# Patient Record
Sex: Male | Born: 1975 | Race: Black or African American | Hispanic: No | Marital: Single | Smoking: Current every day smoker
Health system: Southern US, Community
[De-identification: ages and names within clinical notes are randomized; demographics above are authoritative.]

---

## 2003-10-26 ENCOUNTER — Emergency Department: Payer: Self-pay | Admitting: Unknown Physician Specialty

## 2006-12-07 ENCOUNTER — Other Ambulatory Visit: Payer: Self-pay

## 2006-12-07 ENCOUNTER — Emergency Department: Payer: Self-pay | Admitting: Emergency Medicine

## 2007-09-02 ENCOUNTER — Emergency Department: Payer: Self-pay | Admitting: Emergency Medicine

## 2009-05-31 ENCOUNTER — Emergency Department: Payer: Self-pay | Admitting: Emergency Medicine

## 2014-09-05 ENCOUNTER — Emergency Department
Admission: EM | Admit: 2014-09-05 | Discharge: 2014-09-05 | Disposition: A | Discharge status: Home / Self Care | Payer: Self-pay | Attending: Emergency Medicine | Admitting: Emergency Medicine

## 2014-09-05 ENCOUNTER — Encounter: Payer: Self-pay | Admitting: Emergency Medicine

## 2014-09-05 ENCOUNTER — Emergency Department: Payer: Self-pay

## 2014-09-05 DIAGNOSIS — S8992XA Unspecified injury of left lower leg, initial encounter: Secondary | ICD-10-CM | POA: Insufficient documentation

## 2014-09-05 DIAGNOSIS — Y998 Other external cause status: Secondary | ICD-10-CM | POA: Insufficient documentation

## 2014-09-05 DIAGNOSIS — Z720 Tobacco use: Secondary | ICD-10-CM | POA: Insufficient documentation

## 2014-09-05 DIAGNOSIS — M25562 Pain in left knee: Secondary | ICD-10-CM

## 2014-09-05 DIAGNOSIS — X58XXXA Exposure to other specified factors, initial encounter: Secondary | ICD-10-CM | POA: Insufficient documentation

## 2014-09-05 DIAGNOSIS — M25462 Effusion, left knee: Secondary | ICD-10-CM | POA: Insufficient documentation

## 2014-09-05 DIAGNOSIS — Y9289 Other specified places as the place of occurrence of the external cause: Secondary | ICD-10-CM | POA: Insufficient documentation

## 2014-09-05 DIAGNOSIS — Y9389 Activity, other specified: Secondary | ICD-10-CM | POA: Insufficient documentation

## 2014-09-05 MED ORDER — OXYCODONE-ACETAMINOPHEN 5-325 MG PO TABS: 1.0000 | ORAL_TABLET | Freq: Three times a day (TID) | ORAL | Status: DC | PRN

## 2014-09-05 MED ORDER — MELOXICAM 15 MG PO TABS: 15.0000 mg | ORAL_TABLET | Freq: Every day | ORAL | Status: DC | PRN

## 2014-09-05 NOTE — Discharge Instructions (Signed)
Take medication as prescribed. Apply ice. Rest. Keep in splint and use crutches. Avoid overly strenuous activity.  Follow-up with orthopedic next week. See above to call today to schedule appointment. Return to the ER for new or worsening concerns.  Knee Pain The knee is the complex joint between your thigh and your lower leg. It is made up of bones, tendons, ligaments, and cartilage. The bones that make up the knee are:  The femur in the thigh.  The tibia and fibula in the lower leg.  The patella or kneecap riding in the groove on the lower femur. CAUSES  Knee pain is a common complaint with many causes. A few of these causes are:  Injury, such as:  A ruptured ligament or tendon injury.  Torn cartilage.  Medical conditions, such as:  Gout  Arthritis  Infections  Overuse, over training, or overdoing a physical activity. Knee pain can be minor or severe. Knee pain can accompany debilitating injury. Minor knee problems often respond well to self-care measures or get well on their own. More serious injuries may need medical intervention or even surgery. SYMPTOMS The knee is complex. Symptoms of knee problems can vary widely. Some of the problems are:  Pain with movement and weight bearing.  Swelling and tenderness.  Buckling of the knee.  Inability to straighten or extend your knee.  Your knee locks and you cannot straighten it.  Warmth and redness with pain and fever.  Deformity or dislocation of the kneecap. DIAGNOSIS  Determining what is wrong may be very straight forward such as when there is an injury. It can also be challenging because of the complexity of the knee. Tests to make a diagnosis may include:  Your caregiver taking a history and doing a physical exam.  Routine X-rays can be used to rule out other problems. X-rays will not reveal a cartilage tear. Some injuries of the knee can be diagnosed by:  Arthroscopy a surgical technique by which a small video  camera is inserted through tiny incisions on the sides of the knee. This procedure is used to examine and repair internal knee joint problems. Tiny instruments can be used during arthroscopy to repair the torn knee cartilage (meniscus).  Arthrography is a radiology technique. A contrast liquid is directly injected into the knee joint. Internal structures of the knee joint then become visible on X-ray film.  An MRI scan is a non X-ray radiology procedure in which magnetic fields and a computer produce two- or three-dimensional images of the inside of the knee. Cartilage tears are often visible using an MRI scanner. MRI scans have largely replaced arthrography in diagnosing cartilage tears of the knee.  Blood work.  Examination of the fluid that helps to lubricate the knee joint (synovial fluid). This is done by taking a sample out using a needle and a syringe. TREATMENT The treatment of knee problems depends on the cause. Some of these treatments are:  Depending on the injury, proper casting, splinting, surgery, or physical therapy care will be needed.  Give yourself adequate recovery time. Do not overuse your joints. If you begin to get sore during workout routines, back off. Slow down or do fewer repetitions.  For repetitive activities such as cycling or running, maintain your strength and nutrition.  Alternate muscle groups. For example, if you are a weight lifter, work the upper body on one day and the lower body the next.  Either tight or weak muscles do not give the proper support for  your knee. Tight or weak muscles do not absorb the stress placed on the knee joint. Keep the muscles surrounding the knee strong.  Take care of mechanical problems.  If you have flat feet, orthotics or special shoes may help. See your caregiver if you need help.  Arch supports, sometimes with wedges on the inner or outer aspect of the heel, can help. These can shift pressure away from the side of the knee  most bothered by osteoarthritis.  A brace called an "unloader" brace also may be used to help ease the pressure on the most arthritic side of the knee.  If your caregiver has prescribed crutches, braces, wraps or ice, use as directed. The acronym for this is PRICE. This means protection, rest, ice, compression, and elevation.  Nonsteroidal anti-inflammatory drugs (NSAIDs), can help relieve pain. But if taken immediately after an injury, they may actually increase swelling. Take NSAIDs with food in your stomach. Stop them if you develop stomach problems. Do not take these if you have a history of ulcers, stomach pain, or bleeding from the bowel. Do not take without your caregiver's approval if you have problems with fluid retention, heart failure, or kidney problems.  For ongoing knee problems, physical therapy may be helpful.  Glucosamine and chondroitin are over-the-counter dietary supplements. Both may help relieve the pain of osteoarthritis in the knee. These medicines are different from the usual anti-inflammatory drugs. Glucosamine may decrease the rate of cartilage destruction.  Injections of a corticosteroid drug into your knee joint may help reduce the symptoms of an arthritis flare-up. They may provide pain relief that lasts a few months. You may have to wait a few months between injections. The injections do have a small increased risk of infection, water retention, and elevated blood sugar levels.  Hyaluronic acid injected into damaged joints may ease pain and provide lubrication. These injections may work by reducing inflammation. A series of shots may give relief for as long as 6 months.  Topical painkillers. Applying certain ointments to your skin may help relieve the pain and stiffness of osteoarthritis. Ask your pharmacist for suggestions. Many over the-counter products are approved for temporary relief of arthritis pain.  In some countries, doctors often prescribe topical NSAIDs  for relief of chronic conditions such as arthritis and tendinitis. A review of treatment with NSAID creams found that they worked as well as oral medications but without the serious side effects. PREVENTION  Maintain a healthy weight. Extra pounds put more strain on your joints.  Get strong, stay limber. Weak muscles are a common cause of knee injuries. Stretching is important. Include flexibility exercises in your workouts.  Be smart about exercise. If you have osteoarthritis, chronic knee pain or recurring injuries, you may need to change the way you exercise. This does not mean you have to stop being active. If your knees ache after jogging or playing basketball, consider switching to swimming, water aerobics, or other low-impact activities, at least for a few days a week. Sometimes limiting high-impact activities will provide relief.  Make sure your shoes fit well. Choose footwear that is right for your sport.  Protect your knees. Use the proper gear for knee-sensitive activities. Use kneepads when playing volleyball or laying carpet. Buckle your seat belt every time you drive. Most shattered kneecaps occur in car accidents.  Rest when you are tired. SEEK MEDICAL CARE IF:  You have knee pain that is continual and does not seem to be getting better.  SEEK IMMEDIATE  MEDICAL CARE IF:  Your knee joint feels hot to the touch and you have a high fever. MAKE SURE YOU:   Understand these instructions.  Will watch your condition.  Will get help right away if you are not doing well or get worse. Document Released: 11/02/2006 Document Revised: 03/30/2011 Document Reviewed: 11/02/2006 [facility] Patient Information 2015 [place], [place]. This information is not intended to replace advice given to you by your health care provider. Make sure you discuss any questions you have with your health care provider.

## 2014-09-05 NOTE — ED Provider Notes (Signed)
[facility] Emergency Department Provider Note  ____________________________________________  Time seen: Approximately 1:48 PM  I have reviewed the triage vital signs and the nursing notes.   HISTORY  Chief Complaint Knee Injury   HPI Michael Mcbride is a 39 y.o. male presents to ER for the complaint of left knee pain. Patient reports that yesterday he was at the gas pump pumping gas and had feet planted. States he turned and pivoted to turn to his vehicle and heard and felt a pop in his left knee has had pain since. Patient states that he was able to catch his balance and did not fall. Denies other pain or injury. Patient reports that he has had pain primarily with movement and ambulation since. Denies history of problems with knee.  Patient reports that pain is currently at 7 out of 10 and aching throbbing pain. Denies pain radiation. Denies calf pain. Reports able to put weight on knee but feels like knee will give out. States pain is worse with ambulation.   History reviewed. No pertinent past medical history.  There are no active problems to display for this patient.   History reviewed. No pertinent past surgical history.  No current outpatient prescriptions on file.  Allergies Review of patient's allergies indicates no known allergies.  Family History  Problem Relation Age of Onset  . Family history unknown: Yes    Social History Social History  Substance Use Topics  . Smoking status: Current Every Day Smoker -- 0.50 packs/day    Types: Cigarettes  . Smokeless tobacco: Never Used  . Alcohol Use: No    Review of Systems Constitutional: No fever/chills Eyes: No visual changes. ENT: No sore throat. Cardiovascular: Denies chest pain. Respiratory: Denies shortness of breath. Gastrointestinal: No abdominal pain.  No nausea, no vomiting.  No diarrhea.  No constipation. Genitourinary: Negative for dysuria. Musculoskeletal: Negative for  back pain. Left knee pain as above. Skin: Negative for rash. Neurological: Negative for headaches, focal weakness or numbness.  10-point ROS otherwise negative.  ____________________________________________   PHYSICAL EXAM:  VITAL SIGNS: ED Triage Vitals  Enc Vitals Group     BP 09/05/14 1308 147/75 mmHg     Pulse Rate 09/05/14 1308 89     Resp 09/05/14 1308 18     Temp 09/05/14 1308 98 F (36.7 C)     Temp Source 09/05/14 1308 Oral     SpO2 09/05/14 1308 99 %     Weight 09/05/14 1330 195 lb (88.451 kg)     Height 09/05/14 1330  (1.803 m)     Head Cir --      Peak Flow --      Pain Score 09/05/14 1329 5     Pain Loc --      Pain Edu? --      Excl. in GC? --     Constitutional: Alert and oriented. Well appearing and in no acute distress. Eyes: Conjunctivae are normal. PERRL. EOMI. Head: Atraumatic.  Nose: No congestion/rhinnorhea.  Mouth/Throat: Mucous membranes are moist.  Oropharynx non-erythematous. Neck: No stridor.  No cervical spine tenderness to palpation. Hematological/Lymphatic/Immunilogical: No cervical lymphadenopathy. Cardiovascular: Normal rate, regular rhythm. Grossly normal heart sounds.  Good peripheral circulation. Respiratory: Normal respiratory effort.  No retractions. Lungs CTAB. Gastrointestinal: Soft and nontender. No distention. Normal Bowel sounds.   Musculoskeletal: No lower or upper extremity tenderness nor edema.  No joint effusions. Bilateral pedal pulses equal and easily palpated.  Except: Left anterior knee and left  medial knee moderate tender to palpation, moderate pain with anterior drawer test and medial stress test. Mild anterior knee swelling. No erythema, fluctuance or drainage. Full range of motion. Bilateral pedal pulses equal and easily palpated. Bilateral calf nontender. Mild antalgic gait. Neurologic:  Normal speech and language. No gross focal neurologic deficits are appreciated. No gait instability. Skin:  Skin is warm, dry  and intact. No rash noted. Psychiatric: Mood and affect are normal. Speech and behavior are normal.  ____________________________________________   LABS (all labs ordered are listed, but only abnormal results are displayed)  Labs Reviewed - No data to display  RADIOLOGY  EXAM: LEFT KNEE - COMPLETE 4+ VIEW  COMPARISON: None.  FINDINGS: Moderate suprapatellar joint effusion on cross-table lateral view. Mild medial compartment joint space loss. Patella intact. Bone mineralization is within normal limits. No acute osseous abnormality identified.  IMPRESSION: Medial compartment joint degeneration and moderate sized joint effusion.   Electronically Signed By: Odessa Fleming M.D. On: 09/05/2014 14:12      I, Renford Dills, personally viewed and evaluated these images (plain radiographs) as part of my medical decision making.  ___________________________________________   PROCEDURES  Procedure(s) performed:   INITIAL IMPRESSION / ASSESSMENT AND PLAN / ED COURSE  Pertinent labs & imaging results that were available during my care of the patient were reviewed by me and considered in my medical decision making (see chart for details).  No acute distress. Very well-appearing patient. Presents to ER for the complaint of left knee pain. Patient reports pain onset was as he pivoted with left foot planted causing need to pop with onset of pain. Left knee pain since. Denies other injury. We'll evaluate by x-ray. Concern for meniscus or ligamentous injury, especially medial collateral ligament.  X-ray with medial compartment joint degeneration a moderate size joint effusion, no acute osseous abnormality. Will place patient in knee immobilizer and crutches. When necessary Moberg and Percocet. Rest and ice. Discussed follow-up with orthopedic. Follow-up with orthopedic next week. Patient verbalized understanding and agreed to  plan.   ____________________________________________   FINAL CLINICAL IMPRESSION(S) / ED DIAGNOSES  Final diagnoses:  Knee pain, acute, left  Knee effusion, left       Renford Dills, NP 09/05/14 1432  Myrna Blazer, MD 09/05/14 2240

## 2014-09-05 NOTE — ED Notes (Signed)
Pt states he was at the gas station pumping gas and stepped sideways and felt left knee "pop out of place"  Increased swelling noted and pain is 5/10.  Patient states pain is worse when bearing weight.  Similar episode happened about 6 months ago.

## 2020-10-02 ENCOUNTER — Encounter: Payer: Self-pay | Admitting: Emergency Medicine

## 2020-10-02 ENCOUNTER — Emergency Department: Payer: No Typology Code available for payment source

## 2020-10-02 DIAGNOSIS — M25532 Pain in left wrist: Secondary | ICD-10-CM | POA: Insufficient documentation

## 2020-10-02 DIAGNOSIS — R519 Headache, unspecified: Secondary | ICD-10-CM | POA: Insufficient documentation

## 2020-10-02 DIAGNOSIS — M545 Low back pain, unspecified: Secondary | ICD-10-CM | POA: Insufficient documentation

## 2020-10-02 DIAGNOSIS — Y92410 Unspecified street and highway as the place of occurrence of the external cause: Secondary | ICD-10-CM | POA: Insufficient documentation

## 2020-10-02 DIAGNOSIS — F17210 Nicotine dependence, cigarettes, uncomplicated: Secondary | ICD-10-CM | POA: Diagnosis not present

## 2020-10-02 DIAGNOSIS — M25512 Pain in left shoulder: Secondary | ICD-10-CM | POA: Diagnosis not present

## 2020-10-02 DIAGNOSIS — M542 Cervicalgia: Secondary | ICD-10-CM | POA: Insufficient documentation
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
## 2020-10-03 ENCOUNTER — Emergency Department
Admission: EM | Admit: 2020-10-03 | Discharge: 2020-10-03 | Disposition: A | Discharge status: Home / Self Care | Payer: No Typology Code available for payment source | Attending: Emergency Medicine | Admitting: Emergency Medicine

## 2020-10-03 MED ORDER — ACETAMINOPHEN 500 MG PO TABS
1000.0000 mg | ORAL_TABLET | Freq: Once | ORAL | Status: AC
  Administered 2020-10-03: 1000 mg via ORAL
  Filled 2020-10-03: qty 2

## 2020-10-03 MED ORDER — IBUPROFEN 400 MG PO TABS
400.0000 mg | ORAL_TABLET | Freq: Once | ORAL | Status: AC
  Administered 2020-10-03: 400 mg via ORAL
  Filled 2020-10-03: qty 1

## 2020-10-03 NOTE — Discharge Instructions (Signed)
Your CAT scans of your head and neck and your x-rays of your shoulder and wrist were reassuring today.  There are no broken bones.  You likely have bruising.  Please take Motrin for pain and ice the areas that are uncomfortable.
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
## 2020-10-11 ENCOUNTER — Encounter: Payer: Self-pay | Admitting: Family Medicine

## 2020-10-11 ENCOUNTER — Ambulatory Visit
Admission: RE | Admit: 2020-10-11 | Discharge: 2020-10-11 | Disposition: A | Discharge status: Home / Self Care | Payer: Self-pay | Attending: Family Medicine | Admitting: Family Medicine

## 2020-10-11 ENCOUNTER — Other Ambulatory Visit: Payer: Self-pay

## 2020-10-11 ENCOUNTER — Ambulatory Visit
Admission: RE | Admit: 2020-10-11 | Discharge: 2020-10-11 | Disposition: A | Discharge status: Home / Self Care | Payer: Self-pay | Source: Ambulatory Visit | Attending: Family Medicine | Admitting: Family Medicine

## 2020-10-11 ENCOUNTER — Ambulatory Visit (INDEPENDENT_AMBULATORY_CARE_PROVIDER_SITE_OTHER): Payer: Self-pay | Admitting: Family Medicine

## 2020-10-11 VITALS — BP 124/92 | HR 64 | Temp 98.0°F | Ht 71.0 in | Wt 176.0 lb

## 2020-10-11 DIAGNOSIS — M545 Low back pain, unspecified: Secondary | ICD-10-CM | POA: Insufficient documentation

## 2020-10-11 DIAGNOSIS — M62838 Other muscle spasm: Secondary | ICD-10-CM

## 2020-10-11 DIAGNOSIS — M5416 Radiculopathy, lumbar region: Secondary | ICD-10-CM

## 2020-10-11 MED ORDER — PREDNISONE 50 MG PO TABS: 50.0000 mg | ORAL_TABLET | Freq: Every day | ORAL | 0 refills | Status: DC

## 2020-10-11 MED ORDER — CYCLOBENZAPRINE HCL 10 MG PO TABS: 10.0000 mg | ORAL_TABLET | Freq: Three times a day (TID) | ORAL | 0 refills | Status: DC | PRN

## 2020-10-11 MED ORDER — MELOXICAM 15 MG PO TABS
15.0000 mg | ORAL_TABLET | Freq: Every day | ORAL | 0 refills | Status: DC
Start: 2020-10-11 — End: 2020-10-29
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
## 2020-10-11 NOTE — Patient Instructions (Addendum)
-   Obtain x-rays - Start prednisone and take for full 5-day course - Start meloxicam once daily with food after prednisone complete - Can dose cyclobenzaprine every 8 hours on an as-needed basis for muscle tightness pain (side effect can be drowsiness, no driving/machinery x8 hours after dosing) - Remain out of work until medically cleared - Contact physical therapy with number below - Return for follow-up in 2 weeks, contact our office for questions between now and that  [facility] Physical Therapy:  [place]:  [phone]  [place]: [phone]
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
## 2020-10-11 NOTE — Assessment & Plan Note (Signed)
See additional assessment(s) for plan details. 
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
## 2020-10-21 ENCOUNTER — Ambulatory Visit: Payer: No Typology Code available for payment source | Attending: Family Medicine

## 2020-10-21 ENCOUNTER — Other Ambulatory Visit: Payer: Self-pay

## 2020-10-21 DIAGNOSIS — M6281 Muscle weakness (generalized): Secondary | ICD-10-CM | POA: Diagnosis present

## 2020-10-21 DIAGNOSIS — R262 Difficulty in walking, not elsewhere classified: Secondary | ICD-10-CM | POA: Insufficient documentation

## 2020-10-21 DIAGNOSIS — M5442 Lumbago with sciatica, left side: Secondary | ICD-10-CM | POA: Insufficient documentation
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
## 2020-10-25 ENCOUNTER — Encounter: Payer: Self-pay | Admitting: Physical Therapy

## 2020-10-25 ENCOUNTER — Other Ambulatory Visit: Payer: Self-pay

## 2020-10-25 ENCOUNTER — Ambulatory Visit: Payer: No Typology Code available for payment source | Admitting: Physical Therapy

## 2020-10-25 DIAGNOSIS — R262 Difficulty in walking, not elsewhere classified: Secondary | ICD-10-CM

## 2020-10-25 DIAGNOSIS — M5442 Lumbago with sciatica, left side: Secondary | ICD-10-CM

## 2020-10-25 DIAGNOSIS — M6281 Muscle weakness (generalized): Secondary | ICD-10-CM
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
## 2020-10-29 ENCOUNTER — Ambulatory Visit (INDEPENDENT_AMBULATORY_CARE_PROVIDER_SITE_OTHER): Payer: Self-pay | Admitting: Family Medicine

## 2020-10-29 ENCOUNTER — Ambulatory Visit
Admission: RE | Admit: 2020-10-29 | Discharge: 2020-10-29 | Disposition: A | Discharge status: Home / Self Care | Payer: Self-pay | Source: Ambulatory Visit | Attending: Family Medicine | Admitting: Family Medicine

## 2020-10-29 ENCOUNTER — Ambulatory Visit
Admission: RE | Admit: 2020-10-29 | Discharge: 2020-10-29 | Disposition: A | Discharge status: Home / Self Care | Payer: Self-pay | Attending: Family Medicine | Admitting: Family Medicine

## 2020-10-29 ENCOUNTER — Other Ambulatory Visit: Payer: Self-pay

## 2020-10-29 ENCOUNTER — Encounter: Payer: Self-pay | Admitting: Family Medicine

## 2020-10-29 VITALS — BP 128/84 | HR 73 | Ht 71.0 in | Wt 184.0 lb

## 2020-10-29 DIAGNOSIS — M62838 Other muscle spasm: Secondary | ICD-10-CM | POA: Insufficient documentation

## 2020-10-29 DIAGNOSIS — M5416 Radiculopathy, lumbar region: Secondary | ICD-10-CM

## 2020-10-29 MED ORDER — DULOXETINE HCL 30 MG PO CPEP: ORAL_CAPSULE | ORAL | 0 refills | Status: AC

## 2020-10-29 MED ORDER — DICLOFENAC SODIUM 75 MG PO TBEC: 75.0000 mg | DELAYED_RELEASE_TABLET | Freq: Two times a day (BID) | ORAL | 0 refills | Status: AC
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
## 2020-10-29 NOTE — Patient Instructions (Addendum)
-   Obtain neck x-rays with order provided - Can continue cyclobenzaprine (muscle relaxer) nightly on an as-needed basis - Stop meloxicam - Start diclofenac 75 mg every 12 hours, take with food, continue until follow-up - Start nightly duloxetine, after 2 weeks increase to 2 capsules per Rx instructions, continue until follow-up - Continue physical therapy and home exercises - Return for follow-up in 6 weeks, remain out of work until medically cleared

## 2020-10-29 NOTE — Assessment & Plan Note (Signed)
Patient with persistent symptomatology with focality to the left parascapular region/lower cervical region.  Physical therapy has been working on this issue in addition to medications as previously described.  He denies any overt radiculopathy.  He has preserved upper extremity sensory and motor function though given his persistent symptomatology dedicated plain films of the cervical spine ordered today.  See additional assessment(s) for plan details.

## 2020-10-29 NOTE — Assessment & Plan Note (Signed)
Patient with persistent symptomatology, was unable to tolerate prednisone so self discontinued after 1 dose, has been dosing meloxicam and cyclobenzaprine, attending physical therapy.  Recent radiographs show focal chronic involvement primarily at L4-5.  He continues to have intermittent radiating symptomatology into the left lower leg and concordant examination findings.  I have advised transition to as needed cyclobenzaprine, discontinuation of meloxicam and transition to twice daily scheduled diclofenac 75 mg, duloxetine regimen with titration to 60 mg at 2 weeks from 30 mg.  He will continue physical therapy as well.

## 2020-10-30 ENCOUNTER — Ambulatory Visit: Payer: No Typology Code available for payment source

## 2020-10-30 DIAGNOSIS — R262 Difficulty in walking, not elsewhere classified: Secondary | ICD-10-CM | POA: Diagnosis not present

## 2020-10-30 DIAGNOSIS — M6281 Muscle weakness (generalized): Secondary | ICD-10-CM
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
## 2020-11-01 ENCOUNTER — Ambulatory Visit: Payer: No Typology Code available for payment source

## 2020-11-01 DIAGNOSIS — R262 Difficulty in walking, not elsewhere classified: Secondary | ICD-10-CM | POA: Diagnosis not present

## 2020-11-01 DIAGNOSIS — M5442 Lumbago with sciatica, left side: Secondary | ICD-10-CM

## 2020-11-01 DIAGNOSIS — M6281 Muscle weakness (generalized): Secondary | ICD-10-CM
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
## 2020-11-04 ENCOUNTER — Other Ambulatory Visit: Payer: Self-pay

## 2020-11-04 ENCOUNTER — Ambulatory Visit: Payer: No Typology Code available for payment source

## 2020-11-04 DIAGNOSIS — M5442 Lumbago with sciatica, left side: Secondary | ICD-10-CM

## 2020-11-04 DIAGNOSIS — M6281 Muscle weakness (generalized): Secondary | ICD-10-CM

## 2020-11-04 DIAGNOSIS — R262 Difficulty in walking, not elsewhere classified: Secondary | ICD-10-CM | POA: Diagnosis not present
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
## 2020-11-05 ENCOUNTER — Encounter: Payer: Self-pay | Admitting: Family Medicine

## 2020-11-05 DIAGNOSIS — M545 Low back pain, unspecified: Secondary | ICD-10-CM

## 2020-11-05 DIAGNOSIS — M5416 Radiculopathy, lumbar region: Secondary | ICD-10-CM

## 2020-11-05 DIAGNOSIS — M62838 Other muscle spasm: Secondary | ICD-10-CM

## 2020-11-06 ENCOUNTER — Ambulatory Visit: Payer: No Typology Code available for payment source

## 2020-11-06 ENCOUNTER — Other Ambulatory Visit: Payer: Self-pay

## 2020-11-06 DIAGNOSIS — M5442 Lumbago with sciatica, left side: Secondary | ICD-10-CM

## 2020-11-06 DIAGNOSIS — R262 Difficulty in walking, not elsewhere classified: Secondary | ICD-10-CM | POA: Diagnosis not present

## 2020-11-06 DIAGNOSIS — M6281 Muscle weakness (generalized): Secondary | ICD-10-CM

## 2020-11-06 NOTE — Telephone Encounter (Signed)
Please advise for refill on cyclobenzaprine.  Last filled 10/11/20 #30 with 0 refills.

## 2020-11-06 NOTE — Telephone Encounter (Signed)
Okay to refill #30 with QHS *PRN* dosing
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
## 2020-11-07 MED ORDER — CYCLOBENZAPRINE HCL 10 MG PO TABS: 10.0000 mg | ORAL_TABLET | Freq: Every evening | ORAL | 0 refills | Status: AC | PRN

## 2020-11-15 ENCOUNTER — Ambulatory Visit: Payer: No Typology Code available for payment source

## 2020-12-09 ENCOUNTER — Ambulatory Visit: Payer: Self-pay | Admitting: Family Medicine
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
